# Patient Record
Sex: Female | Born: 1960 | Race: White | Hispanic: No | Smoking: Current every day smoker
Health system: Southern US, Community
[De-identification: ages and names within clinical notes are randomized; demographics above are authoritative.]

## PROBLEM LIST (undated history)

## (undated) DIAGNOSIS — G43909 Migraine, unspecified, not intractable, without status migrainosus: Secondary | ICD-10-CM

## (undated) HISTORY — PX: CHOLECYSTECTOMY: SHX55

---

## 1999-07-17 ENCOUNTER — Other Ambulatory Visit: Admission: RE | Admit: 1999-07-17 | Discharge: 1999-07-17 | Payer: Self-pay | Admitting: Obstetrics

## 1999-12-10 ENCOUNTER — Emergency Department (HOSPITAL_COMMUNITY): Admission: EM | Admit: 1999-12-10 | Discharge: 1999-12-10 | Payer: Self-pay | Admitting: *Deleted

## 2000-07-28 ENCOUNTER — Encounter: Admission: RE | Admit: 2000-07-28 | Discharge: 2000-07-28 | Payer: Self-pay | Admitting: Family Medicine

## 2000-08-17 ENCOUNTER — Encounter: Admission: RE | Admit: 2000-08-17 | Discharge: 2000-08-17 | Payer: Self-pay | Admitting: Family Medicine

## 2000-08-29 ENCOUNTER — Encounter (INDEPENDENT_AMBULATORY_CARE_PROVIDER_SITE_OTHER): Payer: Self-pay | Admitting: *Deleted

## 2000-08-29 LAB — CONVERTED CEMR LAB

## 2000-09-14 ENCOUNTER — Encounter: Admission: RE | Admit: 2000-09-14 | Discharge: 2000-09-14 | Payer: Self-pay | Admitting: Family Medicine

## 2000-09-19 ENCOUNTER — Encounter: Payer: Self-pay | Admitting: *Deleted

## 2000-09-19 ENCOUNTER — Encounter: Admission: RE | Admit: 2000-09-19 | Discharge: 2000-09-19 | Payer: Self-pay | Admitting: *Deleted

## 2000-10-19 ENCOUNTER — Encounter: Admission: RE | Admit: 2000-10-19 | Discharge: 2000-10-19 | Payer: Self-pay | Admitting: *Deleted

## 2000-10-19 ENCOUNTER — Encounter: Admission: RE | Admit: 2000-10-19 | Discharge: 2000-10-19 | Payer: Self-pay | Admitting: Family Medicine

## 2000-10-19 ENCOUNTER — Encounter: Payer: Self-pay | Admitting: *Deleted

## 2000-11-03 ENCOUNTER — Encounter: Admission: RE | Admit: 2000-11-03 | Discharge: 2000-11-03 | Payer: Self-pay | Admitting: Family Medicine

## 2001-05-08 ENCOUNTER — Emergency Department (HOSPITAL_COMMUNITY): Admission: EM | Admit: 2001-05-08 | Discharge: 2001-05-08 | Payer: Self-pay

## 2001-06-22 ENCOUNTER — Encounter: Admission: RE | Admit: 2001-06-22 | Discharge: 2001-06-22 | Payer: Self-pay | Admitting: Obstetrics and Gynecology

## 2001-07-11 ENCOUNTER — Other Ambulatory Visit: Admission: RE | Admit: 2001-07-11 | Discharge: 2001-07-11 | Payer: Self-pay | Admitting: Obstetrics & Gynecology

## 2001-07-11 ENCOUNTER — Encounter: Admission: RE | Admit: 2001-07-11 | Discharge: 2001-07-11 | Payer: Self-pay | Admitting: *Deleted

## 2002-12-12 ENCOUNTER — Emergency Department (HOSPITAL_COMMUNITY): Admission: EM | Admit: 2002-12-12 | Discharge: 2002-12-12 | Payer: Self-pay | Admitting: Emergency Medicine

## 2004-11-29 ENCOUNTER — Ambulatory Visit: Payer: Self-pay | Admitting: Gastroenterology

## 2004-11-30 ENCOUNTER — Inpatient Hospital Stay (HOSPITAL_COMMUNITY): Admission: EM | Admit: 2004-11-30 | Discharge: 2004-12-05 | Payer: Self-pay | Admitting: Emergency Medicine

## 2005-01-05 ENCOUNTER — Ambulatory Visit (HOSPITAL_COMMUNITY): Admission: RE | Admit: 2005-01-05 | Discharge: 2005-01-05 | Payer: Self-pay | Admitting: General Surgery

## 2006-04-29 ENCOUNTER — Encounter (INDEPENDENT_AMBULATORY_CARE_PROVIDER_SITE_OTHER): Payer: Self-pay | Admitting: *Deleted

## 2015-07-29 ENCOUNTER — Ambulatory Visit (INDEPENDENT_AMBULATORY_CARE_PROVIDER_SITE_OTHER): Payer: Commercial Managed Care - PPO | Admitting: Family Medicine

## 2015-07-29 VITALS — BP 130/80 | HR 87 | Temp 97.5°F | Resp 15 | Ht 63.75 in | Wt 148.0 lb

## 2015-07-29 DIAGNOSIS — Z840 Family history of diseases of the skin and subcutaneous tissue: Secondary | ICD-10-CM | POA: Diagnosis not present

## 2015-07-29 DIAGNOSIS — L0100 Impetigo, unspecified: Secondary | ICD-10-CM | POA: Diagnosis not present

## 2015-07-29 DIAGNOSIS — R21 Rash and other nonspecific skin eruption: Secondary | ICD-10-CM | POA: Diagnosis not present

## 2015-07-29 MED ORDER — CEPHALEXIN 500 MG PO CAPS: 500.0000 mg | ORAL_CAPSULE | Freq: Four times a day (QID) | ORAL | Status: DC

## 2015-07-29 MED ORDER — MUPIROCIN 2 % EX OINT: 1.0000 "application " | TOPICAL_OINTMENT | Freq: Four times a day (QID) | CUTANEOUS | Status: DC

## 2015-07-29 NOTE — Progress Notes (Signed)
By signing my name below, I, Mesha Guinyard, attest that this documentation has been prepared under the direction and in the presence of Norberto Sorenson, MD.  Electronically Signed: Arvilla Market, Medical Scribe. 07/29/2015. 11:00 AM.  Subjective:    Patient ID: Angel Zavala, female    DOB: 11-Oct-1960, 55 y.o.   MRN: [id number]  HPI Chief Complaint  Patient presents with  . Rash    Both elbows    HPI Comments: Angel Zavala is a 55 y.o. female who presents to the [facility] complaining of multiple lesions on both elbows, and groin onset a couple of months ago. Lesions on the groin are not on the mucosa. Pt experiences itchiness and purulent drainage from lesions. The lesions start out as purulent blisters with edema surrounding lesion. Pt noticed when it comes back, it comes back worse and only flares when she's back at work. Pt works at [employer] and wears long sleeves at work. Pt has used the same clothes detergent for the past 13 years. Pt has used cortisone cream that relieved the itch.  Pt mentions having a burn on her left flexor surface distal forearm onset a week ago. Pt has not spread this to her husband although he's had skin to skin contact.  Pt had boils behind her ear as a kid.  Pt denies nasal or mouth ulcers, and nail changes.  Pt has FHx of darier's disease.    There are no active problems to display for this patient.  No past medical history on file. No past surgical history on file. Not on File Prior to Admission medications   Not on File   Social History   Social History  . Marital Status: Legally Separated    Spouse Name: N/A  . Number of Children: N/A  . Years of Education: N/A   Occupational History  . Not on file.   Social History Main Topics  . Smoking status: Current Every Day Smoker  . Smokeless tobacco: Not on file  . Alcohol Use: Not on file  . Drug Use: Not on file  . Sexual Activity: Not on file   Other Topics Concern  . Not  on file   Social History Narrative  . No narrative on file   Depression screen [facility] 2/9 07/29/2015  Decreased Interest 0  Down, Depressed, Hopeless 0  PHQ - 2 Score 0    Review of Systems  HENT: Negative for mouth sores.   Genitourinary: Negative for genital sores.  Skin:       Lesions on both elbows and groin    Objective:  BP 130/80 mmHg  Pulse 87  Temp(Src) 97.5 F (36.4 C) (Oral)  Resp 15  Ht 5' 3.75" (1.619 m)  Wt 148 lb (67.132 kg)  BMI 25.61 kg/m2  SpO2 98%  Physical Exam  Constitutional: She appears well-developed and well-nourished. No distress.  HENT:  Head: Normocephalic and atraumatic.  Eyes: Conjunctivae are normal.  Neck: Neck supple.  Cardiovascular: Normal rate.   Pulmonary/Chest: Effort normal.  Neurological: She is alert.  Skin: Skin is warm and dry.  Extensor surfaces of elbows: There are 5-10 well define isolated lesions with erythematous pink scar tissue surrounding central shallow ulceration with scabbing and yellow crust. Lesions serpiginous in nature ranging in diameter from 1-3 cm.  Psychiatric: She has a normal mood and affect. Her behavior is normal.  Nursing note and vitals reviewed.   Assessment & Plan:   1. Rash and nonspecific skin eruption  2. Impetigo   3. Family history of skin disorder   Odd rash - unsure of diagnosis - will cover for impetigo but proceed with derm referral as well as pt's father and sister have a congential skin condition Darier's disease - there are some aspects of the rash that might be similar though likelihood of this is still low as Darier's disease almost always presents in childhood.  If no improvement on oral and top antibiotic and wound culture neg, would next try mid-high potency top steroid.  Orders Placed This Encounter  Procedures  . WOUND CULTURE ([facility] ONLY)    Order Specific Question:  Source    Answer:  left elbow extensor surface, unroofed crust of shallow ulcerative sore - suspect impetigo but  + family history of Dariar  . Ambulatory referral to Dermatology    Referral Priority:  Routine    Referral Type:  Consultation    Referral Reason:  Specialty Services Required    Requested Specialty:  Dermatology    Number of Visits Requested:  1    Meds ordered this encounter  Medications  . cephALEXin (KEFLEX) 500 MG capsule    Sig: Take 1 capsule (500 mg total) by mouth 4 (four) times daily.    Dispense:  28 capsule    Refill:  0  . mupirocin ointment (BACTROBAN) 2 %    Sig: Apply 1 application topically 4 (four) times daily.    Dispense:  30 g    Refill:  1    I personally performed the services described in this documentation, which was scribed in my presence. The recorded information has been reviewed and considered, and addended by me as needed.   Norberto SorensonEva Alyia Lacerte, M.D.  [facility]  [place] [address] [place], [place] [address] [phone] phone [phone] fax  08/03/2015 11:03 PM

## 2015-07-29 NOTE — Patient Instructions (Addendum)
     IF you received an x-ray today, you will receive an invoice from [facility]. Please contact [facility] at [phone] with questions or concerns regarding your invoice.   IF you received labwork today, you will receive an invoice from [facility]. Please contact [facility] at [phone] with questions or concerns regarding your invoice.   Our billing staff will not be able to assist you with questions regarding bills from these companies.  You will be contacted with the lab results as soon as they are available. The fastest way to get your results is to activate your My Chart account. Instructions are located on the last page of this paperwork. If you have not heard from us regarding the results in 2 weeks, please contact this office.     If no significant improvement in symptoms with topical and oral antibiotic in a week, then we might want to try you on a much stronger steroid cream.  Consider taking   Keeping the skin cool and minimizing perspiration. Wear lightweight cotton clothes, avoid hot environments, and use sunscreens, especially during the summer.   ?Start regular use of moisturizers containing keratolytics such as topical urea or lactic acid. Moisturizers reduce irritation, decrease scaling and hyperkeratosis, and improve the skin appearance.   ?Use of antiseptic washes containing Triclosan or chlorhexidine gluconate or bleach baths (1/4 cup [60 mL] of household bleach in a full tub of water). Antiseptics reduce the burden of skin bacteria and odor. Patients may also be instructed to soaks in astringents, such as Burrow or Domeboro solution, if crusting is prominent or bacterial overgrowth is suspected.

## 2015-07-31 LAB — WOUND CULTURE
Gram Stain: NONE SEEN
Gram Stain: NONE SEEN

## 2015-08-06 ENCOUNTER — Ambulatory Visit (INDEPENDENT_AMBULATORY_CARE_PROVIDER_SITE_OTHER): Payer: Commercial Managed Care - PPO | Admitting: Family Medicine

## 2015-08-06 VITALS — BP 132/68 | HR 71 | Temp 97.6°F | Resp 16 | Ht 63.5 in | Wt 149.0 lb

## 2015-08-06 DIAGNOSIS — L0100 Impetigo, unspecified: Secondary | ICD-10-CM

## 2015-08-06 DIAGNOSIS — R21 Rash and other nonspecific skin eruption: Secondary | ICD-10-CM | POA: Diagnosis not present

## 2015-08-06 MED ORDER — FLUCONAZOLE 150 MG PO TABS: 150.0000 mg | ORAL_TABLET | Freq: Once | ORAL | Status: DC

## 2015-08-06 MED ORDER — CEPHALEXIN 500 MG PO CAPS: 500.0000 mg | ORAL_CAPSULE | Freq: Four times a day (QID) | ORAL | Status: DC

## 2015-08-06 NOTE — Progress Notes (Signed)
   HPI  Patient presents today here with rash  Pt reports that rash as improved quite a bit since last viist.   After she finished keflex she had new lesions crop up on BL butouck and L elbow. She tolerated the medication well without complication. She is also using mupirocin.   While waiting in our clinic she had a call from Derm to schedule an appt.   She denies any fevers, chills, sweats, or other concearns.   Her father and sister, as well as many cousins has Darier disease which came on in early teenage years. Her lesions are slightly different than theirs.   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 (no change with switching back) or an allergen exposure at work (now in areas not exposed at work)   PMH: Smoking status noted ROS: Per HPI  Objective: BP 132/68 mmHg  Pulse 71  Temp(Src) 97.6 F (36.4 C)  Resp 16  Ht 5' 3.5" (1.613 m)  Wt 149 lb (67.586 kg)  BMI 25.98 kg/m2  SpO2 100% Gen: NAD, alert, cooperative with exam HEENT: NCAT CV: RRR, good S1/S2, no murmur Resp: CTABL, no wheezes, non-labored Ext: No edema, warm Neuro: Alert and oriented, No gross deficits  Skin:  R elbow with helaing circular lesion on extensor surface measuring approx 3 cm in diameter L elbow with 5-6 distinct sharply demarcated lesions measuring 5-10 mm in diameter, 1 lesion is yellow brown with hyperkeratosis/scaling   Assessment and plan:  # Impetigo Culture last visit positive for GAS, Improved on keflex with worsening symptoms after stopping.  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  Diflucan in case develops yeast infection   Meds ordered this encounter  Medications  . cephALEXin (KEFLEX) 500 MG capsule    Sig: Take 1 capsule (500 mg total) by mouth 4 (four) times daily.    Dispense:  28  capsule    Refill:  0  . fluconazole (DIFLUCAN) 150 MG tablet    Sig: Take 1 tablet (150 mg total) by mouth once. Repeat in 2 days    Dispense:  2 tablet    Refill:  0    Kevin FentonSamuel Bradshaw, MD 10:05 AM

## 2015-08-06 NOTE — Patient Instructions (Addendum)
     IF you received an x-ray today, you will receive an invoice from [facility]. Please contact [facility] at [phone] with questions or concerns regarding your invoice.   IF you received labwork today, you will receive an invoice from [facility]. Please contact [facility] at [phone] with questions or concerns regarding your invoice.   Our billing staff will not be able to assist you with questions regarding bills from these companies.  You will be contacted with the lab results as soon as they are available. The fastest way to get your results is to activate your My Chart account. Instructions are located on the last page of this paperwork. If you have not heard from us regarding the results in 2 weeks, please contact this office.     Great to meet you!  Take all antibiotics, Take diflucan if you develop signs of yeast infection.   Please come back if you have any concerns.

## 2016-02-11 ENCOUNTER — Encounter (HOSPITAL_COMMUNITY): Payer: Self-pay | Admitting: Emergency Medicine

## 2016-02-11 ENCOUNTER — Ambulatory Visit (HOSPITAL_COMMUNITY)
Admission: EM | Admit: 2016-02-11 | Discharge: 2016-02-11 | Disposition: A | Discharge status: Home / Self Care | Payer: Commercial Managed Care - PPO | Attending: Family Medicine | Admitting: Family Medicine

## 2016-02-11 ENCOUNTER — Ambulatory Visit (INDEPENDENT_AMBULATORY_CARE_PROVIDER_SITE_OTHER): Payer: Commercial Managed Care - PPO

## 2016-02-11 DIAGNOSIS — S93402A Sprain of unspecified ligament of left ankle, initial encounter: Secondary | ICD-10-CM

## 2016-02-11 MED ORDER — NAPROXEN 500 MG PO TABS: 500.0000 mg | ORAL_TABLET | Freq: Two times a day (BID) | ORAL | 0 refills | Status: AC

## 2016-02-11 NOTE — ED Provider Notes (Signed)
CSN: [id number]     Arrival date & time 02/11/16  1405 History   First MD Initiated Contact with Patient 02/11/16 1509     Chief Complaint  Patient presents with  . Foot Injury   (Consider location/radiation/quality/duration/timing/severity/associated sxs/prior Treatment) Patient slipped on some ice last night and fell on the floor.  She has some left ankle swelling and tenderness.   The history is provided by the patient.  Foot Injury  Location:  Ankle Injury: yes   Ankle location:  L ankle Pain details:    Quality:  Aching   Radiates to:  Does not radiate   Severity:  Moderate   Onset quality:  Sudden   Duration:  1 day   Timing:  Constant   Progression:  Worsening Chronicity:  New Dislocation: no   Foreign body present:  No foreign bodies Tetanus status:  Unknown Prior injury to area:  No Relieved by:  Nothing Worsened by:  Nothing Ineffective treatments:  None tried Associated symptoms: stiffness and swelling     History reviewed. No pertinent past medical history. Past Surgical History:  Procedure Laterality Date  . CHOLECYSTECTOMY     History reviewed. No pertinent family history. Social History  Substance Use Topics  . Smoking status: Current Every Day Smoker    Packs/day: 1.00    Types: Cigarettes  . Smokeless tobacco: Never Used  . Alcohol use No   OB History    No data available     Review of Systems  Constitutional: Negative.   HENT: Negative.   Eyes: Negative.   Respiratory: Negative.   Cardiovascular: Negative.   Gastrointestinal: Negative.   Endocrine: Negative.   Genitourinary: Negative.   Musculoskeletal: Positive for arthralgias and stiffness.  Skin: Negative.   Allergic/Immunologic: Negative.   Neurological: Negative.   Hematological: Negative.   Psychiatric/Behavioral: Negative.     Allergies  Patient has no known allergies.  Home Medications   Prior to Admission medications   Medication Sig Start Date End Date Taking?  Authorizing Provider  cephALEXin (KEFLEX) 500 MG capsule Take 1 capsule (500 mg total) by mouth 4 (four) times daily. 08/06/15   Elenora GammaSamuel L Bradshaw, MD  fluconazole (DIFLUCAN) 150 MG tablet Take 1 tablet (150 mg total) by mouth once. Repeat in 2 days 08/06/15   Elenora GammaSamuel L Bradshaw, MD  mupirocin ointment (BACTROBAN) 2 % Apply 1 application topically 4 (four) times daily. 07/29/15   Sherren MochaEva N Shaw, MD  naproxen (NAPROSYN) 500 MG tablet Take 1 tablet (500 mg total) by mouth 2 (two) times daily with a meal. 02/11/16   Deatra CanterWilliam J Hannahmarie Asberry, FNP   Meds Ordered and Administered this Visit  Medications - No data to display  BP 126/57 (BP Location: Left Arm)   Pulse 84   Temp 97.9 F (36.6 C)   SpO2 98%  No data found.   Physical Exam  Constitutional: She appears well-developed and well-nourished.  HENT:  Head: Normocephalic and atraumatic.  Eyes: Conjunctivae and EOM are normal. Pupils are equal, round, and reactive to light.  Neck: Normal range of motion. Neck supple.  Cardiovascular: Normal rate, regular rhythm and normal heart sounds.   Pulmonary/Chest: Effort normal and breath sounds normal.  Abdominal: Soft. Bowel sounds are normal.  Musculoskeletal: She exhibits tenderness.  Left ankle with swelling over lateral malleolus with bruising over foot  Nursing note and vitals reviewed.   Urgent Care Course   Clinical Course     Procedures (including critical care time)  Labs Review  Labs Reviewed - No data to display  Imaging Review Dg Ankle Complete Left  Result Date: 02/11/2016 CLINICAL DATA:  Lateral left ankle pain after fall last night EXAM: LEFT ANKLE COMPLETE - 3+ VIEW COMPARISON:  None. FINDINGS: Three views of the left ankle submitted. No acute fracture or subluxation. Ankle mortise is preserved. There is plantar and posterior spurring of calcaneus. Mild spurring dorsal tarsal region IMPRESSION: No acute fracture or subluxation. Plantar and posterior spurring of calcaneus.  Electronically Signed   By: Natasha MeadLiviu  Pop M.D.   On: 02/11/2016 16:31     Visual Acuity Review  Right Eye Distance:   Left Eye Distance:   Bilateral Distance:    Right Eye Near:   Left Eye Near:    Bilateral Near:         MDM   1. Sprain of left ankle, unspecified ligament, initial encounter    Naprosyn 500mg  one po bid x 10 days #20  ASO wrap left ankle      Deatra CanterWilliam J Aerilynn Goin, FNP 02/11/16 1643

## 2016-02-11 NOTE — ED Triage Notes (Signed)
Pt slipped on some water last night and fell on the floor.  She has bruising and pain to her left upper foot.  She is not sure how the foot was injured.

## 2016-02-18 ENCOUNTER — Ambulatory Visit (INDEPENDENT_AMBULATORY_CARE_PROVIDER_SITE_OTHER): Payer: Commercial Managed Care - PPO | Admitting: Physician Assistant

## 2016-02-18 ENCOUNTER — Ambulatory Visit (INDEPENDENT_AMBULATORY_CARE_PROVIDER_SITE_OTHER): Payer: Commercial Managed Care - PPO

## 2016-02-18 VITALS — BP 148/86 | HR 90 | Temp 98.6°F | Resp 16 | Ht 63.5 in | Wt 175.0 lb

## 2016-02-18 DIAGNOSIS — S92335A Nondisplaced fracture of third metatarsal bone, left foot, initial encounter for closed fracture: Secondary | ICD-10-CM | POA: Diagnosis not present

## 2016-02-18 DIAGNOSIS — S92515A Nondisplaced fracture of proximal phalanx of left lesser toe(s), initial encounter for closed fracture: Secondary | ICD-10-CM | POA: Diagnosis not present

## 2016-02-18 DIAGNOSIS — S92325A Nondisplaced fracture of second metatarsal bone, left foot, initial encounter for closed fracture: Secondary | ICD-10-CM

## 2016-02-18 DIAGNOSIS — M79672 Pain in left foot: Secondary | ICD-10-CM | POA: Diagnosis not present

## 2016-02-18 MED ORDER — TRAMADOL HCL 50 MG PO TABS: 50.0000 mg | ORAL_TABLET | Freq: Three times a day (TID) | ORAL | 0 refills | Status: AC | PRN

## 2016-02-18 NOTE — Patient Instructions (Addendum)
Ice and rest and elevate.   Wear camwalker Use Naproxen for pain Recheck in 2 weeks    IF you received an x-ray today, you will receive an invoice from [facility]. Please contact [facility] at [phone] with questions or concerns regarding your invoice.   IF you received labwork today, you will receive an invoice from [facility]. Please contact [facility] at [phone] with questions or concerns regarding your invoice.   Our billing staff will not be able to assist you with questions regarding bills from these companies.  You will be contacted with the lab results as soon as they are available. The fastest way to get your results is to activate your My Chart account. Instructions are located on the last page of this paperwork. If you have not heard from us regarding the results in 2 weeks, please contact this office.

## 2016-02-18 NOTE — Progress Notes (Signed)
   Angel LodgeRetha Brogden  MRN: [id number] DOB: 01/15/1961  Subjective:  Pt presents to clinic with left foot pain that started after a fall on 02/10/2016.  She is unsure how she feel.  There was not bad pain right away but later that evening the pain had started in her foot and she had increase pain with walking.  She was [facility] and treated with ASO brace and had neg ankle films.  The pain is not really in her ankle but more in her foot and she does not feel like the pain has improved at all.  She has been unable to work because of the pain - she has been elevating the foot mainly since she has been at home and the swelling has not improved./  She works on her feet and has been unable to work because of the pain.  She has using the naproxen which has not really helped.    Review of Systems  There are no active problems to display for this patient.   Current Outpatient Prescriptions on File Prior to Visit  Medication Sig Dispense Refill  . naproxen (NAPROSYN) 500 MG tablet Take 1 tablet (500 mg total) by mouth 2 (two) times daily with a meal. 20 tablet 0   No current facility-administered medications on file prior to visit.     No Known Allergies  Pt patients past, family and social history were reviewed and updated.   Objective:  BP (!) 148/86   Pulse 90   Temp 98.6 F (37 C) (Oral)   Resp 16   Ht 5' 3.5" (1.613 m)   Wt 175 lb (79.4 kg)   SpO2 98%   BMI 30.51 kg/m   Physical Exam  Constitutional: She is oriented to person, place, and time and well-developed, well-nourished, and in no distress.  HENT:  Head: Normocephalic and atraumatic.  Right Ear: Hearing and external ear normal.  Left Ear: Hearing and external ear normal.  Eyes: Conjunctivae are normal.  Neck: Normal range of motion.  Pulmonary/Chest: Effort normal.  Musculoskeletal:       Left foot: There is tenderness and swelling. There is no deformity.       Feet:  Neurological: She is alert and oriented to  person, place, and time. Gait normal.  Skin: Skin is warm and dry.  Psychiatric: Mood, memory, affect and judgment normal.  Vitals reviewed.   Assessment and Plan :  Foot pain, left - Plan: DG Foot Complete Left  Closed nondisplaced fracture of second metatarsal bone of left foot, initial encounter - Plan: traMADol (ULTRAM) 50 MG tablet  Closed nondisplaced fracture of third metatarsal bone of left foot, initial encounter - Plan: traMADol (ULTRAM) 50 MG tablet  Closed nondisplaced fracture of proximal phalanx of lesser toe of left foot, initial encounter - Plan: traMADol (ULTRAM) 50 MG tablet  Short camwalker placed - pt will elevate and ice - use pain medication as needed for pain  Benny LennertSarah Demontez Novack PA-C  [facility] 02/18/2016 10:26 AM

## 2016-02-20 ENCOUNTER — Telehealth: Payer: Self-pay

## 2016-02-20 NOTE — Telephone Encounter (Signed)
Patient needs forms completed by Angel LennertSarah Zavala for her fractured foot. I have completed what I could from the OV notes and highlighted the areas that need updated. I will place the forms in Angel's box on 02/20/16 if you could please return them to the FMLA/Disability box at the 102 checkout desk within 5-7 business days. Thank you!

## 2016-02-21 NOTE — Telephone Encounter (Signed)
Done

## 2016-02-26 NOTE — Telephone Encounter (Signed)
Paperwork scanned and faxed to [employer] on 02/26/16

## 2016-03-05 DIAGNOSIS — Z0271 Encounter for disability determination: Secondary | ICD-10-CM

## 2016-05-07 ENCOUNTER — Emergency Department (HOSPITAL_COMMUNITY)
Admission: EM | Admit: 2016-05-07 | Discharge: 2016-05-07 | Disposition: A | Discharge status: Home / Self Care | Payer: Commercial Managed Care - PPO | Attending: Emergency Medicine | Admitting: Emergency Medicine

## 2016-05-07 ENCOUNTER — Encounter (HOSPITAL_COMMUNITY): Payer: Self-pay | Admitting: Emergency Medicine

## 2016-05-07 DIAGNOSIS — S60221A Contusion of right hand, initial encounter: Secondary | ICD-10-CM | POA: Insufficient documentation

## 2016-05-07 DIAGNOSIS — Y9389 Activity, other specified: Secondary | ICD-10-CM | POA: Diagnosis not present

## 2016-05-07 DIAGNOSIS — X501XXA Overexertion from prolonged static or awkward postures, initial encounter: Secondary | ICD-10-CM | POA: Insufficient documentation

## 2016-05-07 DIAGNOSIS — Y929 Unspecified place or not applicable: Secondary | ICD-10-CM | POA: Diagnosis not present

## 2016-05-07 DIAGNOSIS — R519 Headache, unspecified: Secondary | ICD-10-CM

## 2016-05-07 DIAGNOSIS — R51 Headache: Secondary | ICD-10-CM | POA: Insufficient documentation

## 2016-05-07 DIAGNOSIS — F17210 Nicotine dependence, cigarettes, uncomplicated: Secondary | ICD-10-CM | POA: Insufficient documentation

## 2016-05-07 DIAGNOSIS — Y999 Unspecified external cause status: Secondary | ICD-10-CM | POA: Diagnosis not present

## 2016-05-07 HISTORY — DX: Migraine, unspecified, not intractable, without status migrainosus: G43.909

## 2016-05-07 LAB — BASIC METABOLIC PANEL
Anion gap: 7 (ref 5–15)
BUN: 23 mg/dL — ABNORMAL HIGH (ref 6–20)
CO2: 23 mmol/L (ref 22–32)
CREATININE: 0.6 mg/dL (ref 0.44–1.00)
Calcium: 9.3 mg/dL (ref 8.9–10.3)
Chloride: 109 mmol/L (ref 101–111)
GFR calc Af Amer: >60 mL/min (ref >60)
Glucose, Bld: 104 mg/dL — ABNORMAL HIGH (ref 65–99)
Potassium: 4.1 mmol/L (ref 3.5–5.1)
SODIUM: 139 mmol/L (ref 135–145)

## 2016-05-07 LAB — CBC WITH DIFFERENTIAL/PLATELET
Basophils Absolute: 0 10*3/uL (ref 0.0–0.1)
Basophils Relative: 0 %
EOS ABS: 0.3 10*3/uL (ref 0.0–0.7)
Eosinophils Relative: 4 %
HEMATOCRIT: 39.2 % (ref 36.0–46.0)
HEMOGLOBIN: 13.1 g/dL (ref 12.0–15.0)
Lymphocytes Relative: 20 %
Lymphs Abs: 1.6 10*3/uL (ref 0.7–4.0)
MCH: 30.5 pg (ref 26.0–34.0)
MCHC: 33.4 g/dL (ref 30.0–36.0)
MCV: 91.2 fL (ref 78.0–100.0)
Monocytes Absolute: 0.7 10*3/uL (ref 0.1–1.0)
Monocytes Relative: 9 %
NEUTROS ABS: 5.2 10*3/uL (ref 1.7–7.7)
Neutrophils Relative %: 67 %
Platelets: 276 10*3/uL (ref 150–400)
RBC: 4.3 MIL/uL (ref 3.87–5.11)
RDW: 13.7 % (ref 11.5–15.5)
WBC: 7.8 10*3/uL (ref 4.0–10.5)

## 2016-05-07 MED ORDER — KETOROLAC TROMETHAMINE 15 MG/ML IJ SOLN
15.0000 mg | Freq: Once | INTRAMUSCULAR | Status: AC
  Administered 2016-05-07: 15 mg via INTRAVENOUS
  Filled 2016-05-07: qty 1

## 2016-05-07 MED ORDER — METOCLOPRAMIDE HCL 5 MG/ML IJ SOLN
10.0000 mg | Freq: Once | INTRAMUSCULAR | Status: AC
  Administered 2016-05-07: 10 mg via INTRAVENOUS
  Filled 2016-05-07: qty 2

## 2016-05-07 MED ORDER — SODIUM CHLORIDE 0.9 % IV BOLUS (SEPSIS)
1000.0000 mL | Freq: Once | INTRAVENOUS | Status: AC
  Administered 2016-05-07: 1000 mL via INTRAVENOUS

## 2016-05-07 MED ORDER — DIPHENHYDRAMINE HCL 50 MG/ML IJ SOLN
25.0000 mg | Freq: Once | INTRAMUSCULAR | Status: AC
  Administered 2016-05-07: 25 mg via INTRAVENOUS
  Filled 2016-05-07: qty 1

## 2016-05-07 NOTE — ED Triage Notes (Signed)
Pt to ed via pov, with c/o migraine--  Has not had a migraine in 1 yr-- also had a blood vessel rupture in right hand, concerned that she may have the same thing happening in head. Pt recently dx with eczema last year.

## 2016-05-07 NOTE — ED Provider Notes (Signed)
[facility]-EMERGENCY DEPT Provider Note   CSN: [id number] Arrival date & time: 05/07/16  [id number]     History   Chief Complaint Chief Complaint  Patient presents with  . Migraine  . bruise to right hand    HPI Angel Zavala is a 56 y.o. female.  HPI  56 year old female with a history of migraines for decades presents with a headache since 2 or 3 AM this morning. She has not had a migraine in over 1 year so this concerned her. She states that she woke up with the headache. It was moderate and has progressively worsened since onset. It feels like it starts in the back of her neck and radiates to the front of her head bilaterally. It is a throbbing sensation. There is no neck stiffness, nausea, vomiting, blurry vision, or weakness/numbness. She has mild photophobia. Thinks this is a little different than her typical migraines but it's been a while so she can't remember. She has not had fevers. She has not been ill recently. She tried Advil and a Zyrtec with no relief. 3 days ago when she was driving in the car she had some swelling on her right dorsal hand and felt a pop and since then has had nonpainful ecchymosis to her right hand. No trauma to the hand. No other episodes of bleeding/bruising.  Past Medical History:  Diagnosis Date  . Migraine     There are no active problems to display for this patient.   Past Surgical History:  Procedure Laterality Date  . CHOLECYSTECTOMY      OB History    No data available       Home Medications    Prior to Admission medications   Medication Sig Start Date End Date Taking? Authorizing Provider  naproxen (NAPROSYN) 500 MG tablet Take 1 tablet (500 mg total) by mouth 2 (two) times daily with a meal. 02/11/16   Deatra CanterWilliam J Oxford, FNP  traMADol (ULTRAM) 50 MG tablet Take 1 tablet (50 mg total) by mouth every 8 (eight) hours as needed. 02/18/16   Morrell RiddleSarah L Weber, PA-C    Family History No family history on file.  Social History Social History    Substance Use Topics  . Smoking status: Current Every Day Smoker    Packs/day: 1.00    Types: Cigarettes  . Smokeless tobacco: Never Used  . Alcohol use No     Allergies   Patient has no known allergies.   Review of Systems Review of Systems  Constitutional: Negative for fever.  Eyes: Positive for photophobia. Negative for visual disturbance.  Gastrointestinal: Negative for nausea and vomiting.  Musculoskeletal: Positive for neck pain. Negative for neck stiffness.  Neurological: Positive for headaches. Negative for weakness and numbness.  All other systems reviewed and are negative.    Physical Exam Updated Vital Signs BP 107/65 (BP Location: Left Arm)   Pulse 62   Temp 97.8 F (36.6 C) (Oral)   Resp 16   Ht 5\' 4"  (1.626 m)   Wt 175 lb (79.4 kg)   SpO2 98%   BMI 30.04 kg/m   Physical Exam  Constitutional: She is oriented to person, place, and time. She appears well-developed and well-nourished. No distress.  HENT:  Head: Normocephalic and atraumatic.  Right Ear: External ear normal.  Left Ear: External ear normal.  Nose: Nose normal.  Eyes: EOM are normal. Pupils are equal, round, and reactive to light. Right eye exhibits no discharge. Left eye exhibits no discharge.  Mild photophobia  Neck: Normal range of motion. Neck supple.  Mild posterior neck tenderness, normal ROM, no meningismus  Cardiovascular: Normal rate, regular rhythm and normal heart sounds.   Pulmonary/Chest: Effort normal and breath sounds normal.  Abdominal: She exhibits no distension.  Musculoskeletal:  Right dorsal hand has ecchymosis. Small area of swelling without tenderness over dorsal medial hand  Neurological: She is alert and oriented to person, place, and time.  CN 3-12 grossly intact. 5/5 strength in all 4 extremities. Grossly normal sensation. Normal finger to nose.   Skin: Skin is warm and dry. She is not diaphoretic.  Nursing note and vitals reviewed.    ED Treatments /  Results  Labs (all labs ordered are listed, but only abnormal results are displayed) Labs Reviewed  BASIC METABOLIC PANEL - Abnormal; Notable for the following:       Result Value   Glucose, Bld 104 (*)    BUN 23 (*)    All other components within normal limits  CBC WITH DIFFERENTIAL/PLATELET    EKG  EKG Interpretation None       Radiology No results found.  Procedures Procedures (including critical care time)  Medications Ordered in ED Medications  sodium chloride 0.9 % bolus 1,000 mL (0 mLs Intravenous Stopped 05/07/16 1231)  metoCLOPramide (REGLAN) injection 10 mg (10 mg Intravenous Given 05/07/16 1027)  diphenhydrAMINE (BENADRYL) injection 25 mg (25 mg Intravenous Given 05/07/16 1026)  ketorolac (TORADOL) 15 MG/ML injection 15 mg (15 mg Intravenous Given 05/07/16 1022)     Initial Impression / Assessment and Plan / ED Course  I have reviewed the triage vital signs and the nursing notes.  Pertinent labs & imaging results that were available during my care of the patient were reviewed by me and considered in my medical decision making (see chart for details).  Clinical Course as of May 07 1732  Fri May 07, 2016  1007 Exam is unremarkable. Maximum intensity of headache well over 1 hour. Suspicion for SAH low. After discussion, through shared decision making, will hold on CT. Given the spontaneous ecchymosis, will check basic labs with platelets.  [SG]  1141 Patient feels much better. Headache essentially gone. Labwork reassuring. Suspicion for acute CNS emergency is low. Discussed strict return precautions with patient, advised to f/u closely with PCP  [SG]    Clinical Course User Index [SG] Pricilla Loveless, MD    Unclear why she has the ecchymosis in right hand but no tenderness or injury to suggest needing imaging. HA improved. D/c home with return precautions.   Final Clinical Impressions(s) / ED Diagnoses   Final diagnoses:  Frontal headache    New  Prescriptions Discharge Medication List as of 05/07/2016 11:48 AM       Pricilla Loveless, MD 05/07/16 1735

## 2018-06-18 IMAGING — DX DG FOOT COMPLETE 3+V*L*
3 series · 3 of 3 positions shown · non-contrast
Comparison: 02/11/2016 ankle radiographs.

CLINICAL DATA: Lateral forefoot pain after fall on February 10, 2016

EXAM:
LEFT FOOT - COMPLETE 3+ VIEW

[foot ap]
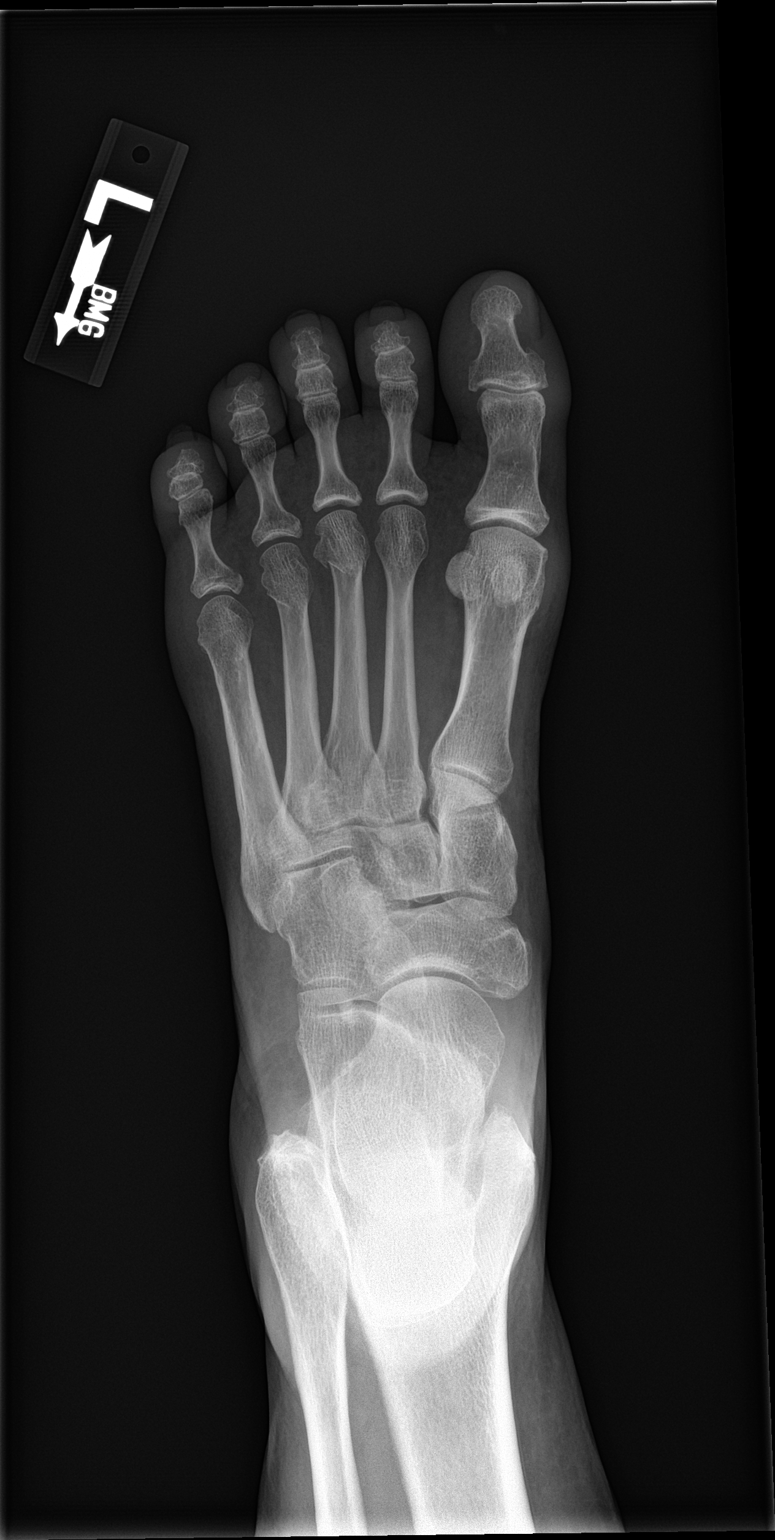

[foot obl]
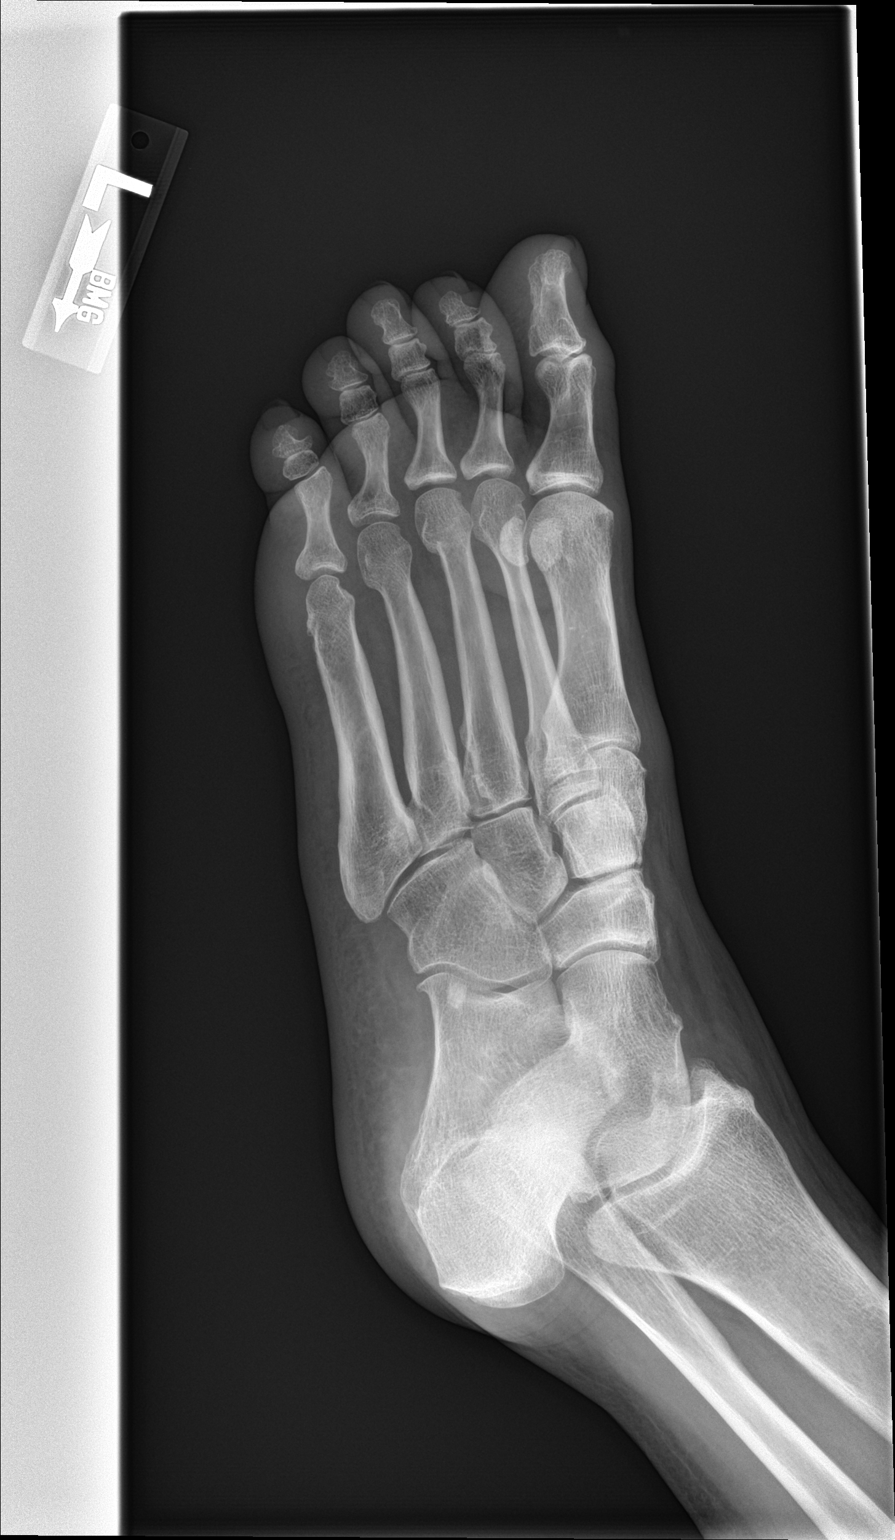

[foot lat]
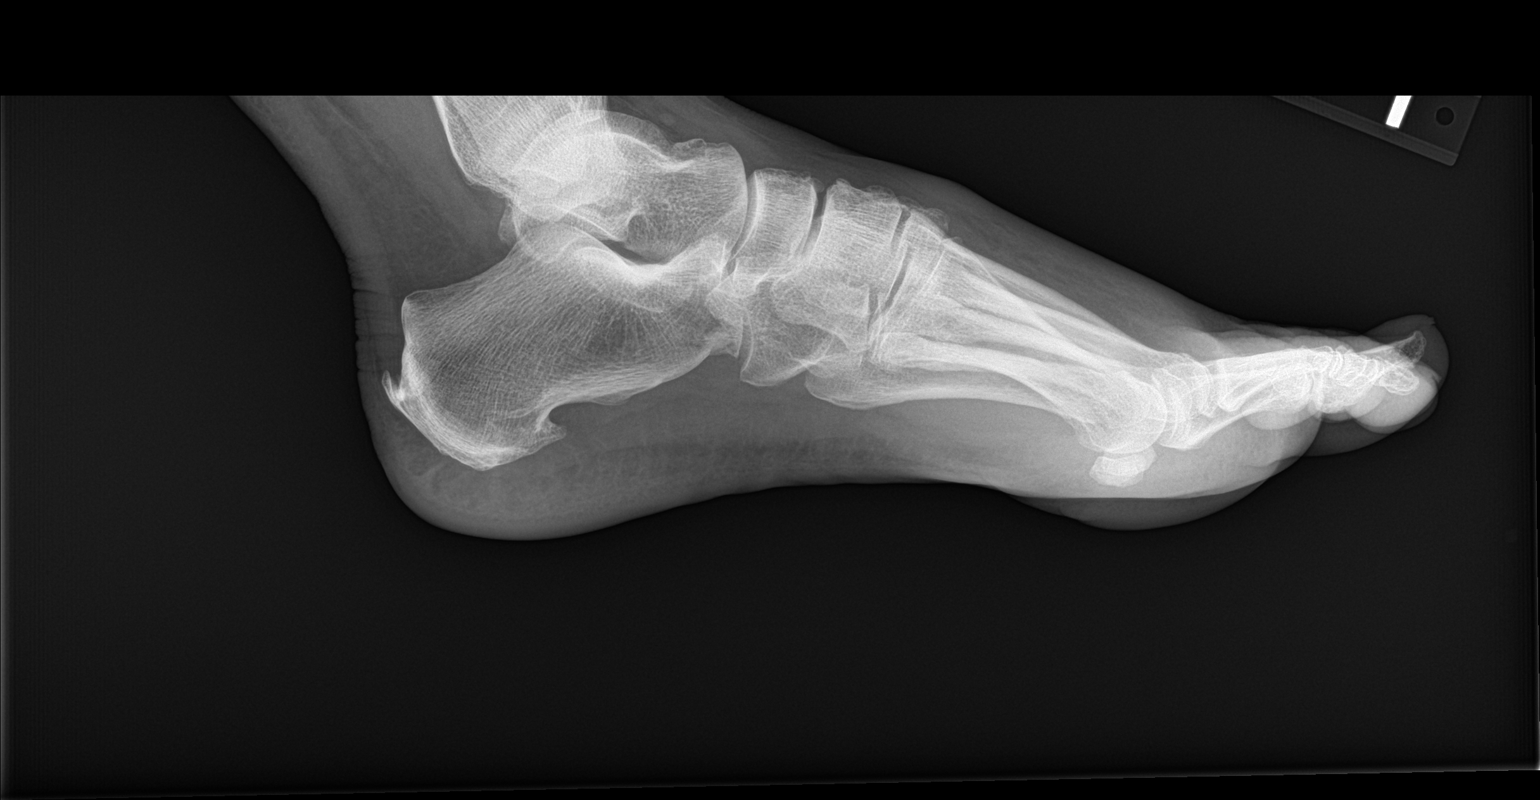

[3 of 3 positions shown; findings below may reference images not displayed]

FINDINGS: Bony deformity compatible with transverse fracture at the distal
metaphysis of the third metatarsal. Suspected concomitant but
nondisplaced fracture of the distal fourth metatarsal metaphysis.

On the frontal projection there is abnormal lucency along the medial
metaphysis of the base of the proximal phalanx of the small toe
probably extending to the articular surface, compatible with a
fracture.

Lisfranc joint alignment normal. There is some minimal spurring
along the Lisfranc joint and at the calcaneocuboid articulation.

Dorsal spurring in the midfoot. Plantar and Achilles calcaneal spurs
with subcutaneous edema below the heel.

Dorsal soft tissue swelling along the forefoot.
IMPRESSION: 1. Fractures of the distal metaphyses of the third and fourth
metatarsals along with a nondisplaced fracture of the base of the
proximal phalanx of the small toe.
2. Subcutaneous edema tracking along the dorsum of the foot.
3. Plantar and Achilles calcaneal spurs.

## 2023-11-30 DEATH — deceased
